# Patient Record
Sex: Male | Born: 1955 | Race: White | Hispanic: No | Marital: Single | Smoking: Current every day smoker
Health system: Southern US, Community
[De-identification: ages and names within clinical notes are randomized; demographics above are authoritative.]

## PROBLEM LIST (undated history)

## (undated) DIAGNOSIS — C801 Malignant (primary) neoplasm, unspecified: Secondary | ICD-10-CM

## (undated) DIAGNOSIS — M549 Dorsalgia, unspecified: Secondary | ICD-10-CM

## (undated) DIAGNOSIS — K859 Acute pancreatitis without necrosis or infection, unspecified: Secondary | ICD-10-CM

## (undated) HISTORY — PX: TONGUE SURGERY: SHX810

---

## 1999-02-17 ENCOUNTER — Emergency Department (HOSPITAL_COMMUNITY): Admission: EM | Admit: 1999-02-17 | Discharge: 1999-02-17 | Payer: Self-pay | Admitting: Emergency Medicine

## 2004-06-13 ENCOUNTER — Emergency Department: Payer: Self-pay | Admitting: Emergency Medicine

## 2004-06-13 ENCOUNTER — Other Ambulatory Visit: Payer: Self-pay

## 2005-10-01 ENCOUNTER — Emergency Department: Payer: Self-pay | Admitting: Emergency Medicine

## 2006-06-27 ENCOUNTER — Emergency Department: Payer: Self-pay | Admitting: General Practice

## 2006-10-22 ENCOUNTER — Emergency Department: Payer: Self-pay | Admitting: Emergency Medicine

## 2007-04-08 ENCOUNTER — Emergency Department: Payer: Self-pay | Admitting: Emergency Medicine

## 2007-06-18 ENCOUNTER — Emergency Department: Payer: Self-pay | Admitting: Internal Medicine

## 2008-03-08 ENCOUNTER — Emergency Department: Payer: Self-pay | Admitting: Internal Medicine

## 2008-04-16 ENCOUNTER — Emergency Department: Payer: Self-pay | Admitting: Emergency Medicine

## 2008-04-16 ENCOUNTER — Other Ambulatory Visit: Payer: Self-pay

## 2008-04-21 ENCOUNTER — Emergency Department: Payer: Self-pay | Admitting: Internal Medicine

## 2008-04-21 ENCOUNTER — Other Ambulatory Visit: Payer: Self-pay

## 2008-05-18 ENCOUNTER — Emergency Department: Payer: Self-pay | Admitting: Emergency Medicine

## 2008-12-07 ENCOUNTER — Emergency Department: Payer: Self-pay | Admitting: Emergency Medicine

## 2009-05-27 ENCOUNTER — Emergency Department: Payer: Self-pay | Admitting: Emergency Medicine

## 2009-08-18 ENCOUNTER — Emergency Department: Payer: Self-pay | Admitting: Emergency Medicine

## 2009-11-30 ENCOUNTER — Emergency Department: Payer: Self-pay | Admitting: Emergency Medicine

## 2010-04-17 ENCOUNTER — Emergency Department: Payer: Self-pay | Admitting: Emergency Medicine

## 2010-12-27 ENCOUNTER — Emergency Department (HOSPITAL_COMMUNITY)
Admission: EM | Admit: 2010-12-27 | Discharge: 2010-12-27 | Disposition: A | Discharge status: Home / Self Care | Payer: Self-pay | Attending: Emergency Medicine | Admitting: Emergency Medicine

## 2010-12-27 DIAGNOSIS — M545 Low back pain, unspecified: Secondary | ICD-10-CM | POA: Insufficient documentation

## 2010-12-27 DIAGNOSIS — Z85819 Personal history of malignant neoplasm of unspecified site of lip, oral cavity, and pharynx: Secondary | ICD-10-CM | POA: Insufficient documentation

## 2011-01-25 ENCOUNTER — Emergency Department: Payer: Self-pay | Admitting: Emergency Medicine

## 2011-03-04 ENCOUNTER — Encounter: Payer: Self-pay | Admitting: *Deleted

## 2011-03-04 ENCOUNTER — Emergency Department (HOSPITAL_COMMUNITY)
Admission: EM | Admit: 2011-03-04 | Discharge: 2011-03-04 | Disposition: A | Discharge status: Home / Self Care | Payer: Self-pay | Attending: Emergency Medicine | Admitting: Emergency Medicine

## 2011-03-04 DIAGNOSIS — Z859 Personal history of malignant neoplasm, unspecified: Secondary | ICD-10-CM | POA: Insufficient documentation

## 2011-03-04 DIAGNOSIS — M545 Low back pain, unspecified: Secondary | ICD-10-CM | POA: Insufficient documentation

## 2011-03-04 DIAGNOSIS — G8929 Other chronic pain: Secondary | ICD-10-CM | POA: Insufficient documentation

## 2011-03-04 DIAGNOSIS — F17200 Nicotine dependence, unspecified, uncomplicated: Secondary | ICD-10-CM | POA: Insufficient documentation

## 2011-03-04 DIAGNOSIS — M549 Dorsalgia, unspecified: Secondary | ICD-10-CM

## 2011-03-04 HISTORY — DX: Dorsalgia, unspecified: M54.9

## 2011-03-04 HISTORY — DX: Malignant (primary) neoplasm, unspecified: C80.1

## 2011-03-04 MED ORDER — TRAMADOL HCL 50 MG PO TABS: 50.0000 mg | ORAL_TABLET | Freq: Four times a day (QID) | ORAL | Status: AC | PRN

## 2011-03-04 MED ORDER — METHOCARBAMOL 500 MG PO TABS: ORAL_TABLET | ORAL | Status: DC

## 2011-03-04 NOTE — ED Provider Notes (Signed)
History     Chief Complaint  Patient presents with  . Back Pain   HPI Comments: Pt has hx "pinched nerve". He has been doing more walking and standing than usual and has some increase in pain. He states he can not afford a specialist or pain management doctor. He states the pain is getting worse and presents for assistance.  Patient is a 55 y.o. male presenting with back pain. The history is provided by the patient.  Back Pain  This is a chronic problem. The problem occurs daily. The problem has not changed since onset.The pain is present in the lumbar spine. The quality of the pain is described as shooting and aching. The pain radiates to the right thigh. The pain is moderate. Stiffness is present all day. Pertinent negatives include no chest pain, no abdominal pain, no perianal numbness, no bladder incontinence and no dysuria. He has tried bed rest for the symptoms. The treatment provided no relief.    Past Medical History  Diagnosis Date  . Back pain   . Cancer     mouth and throat cancer.     Past Surgical History  Procedure Date  . Tongue surgery     due to cancer    No family history on file.  History  Substance Use Topics  . Smoking status: Current Everyday Smoker -- 0.5 packs/day for 40 years    Types: Cigarettes  . Smokeless tobacco: Not on file  . Alcohol Use: Yes     occassionaly      Review of Systems  Constitutional: Negative for activity change.       All ROS Neg except as noted in HPI  HENT: Negative for nosebleeds and neck pain.        Tongue surg due to cancer  Eyes: Negative for photophobia and discharge.  Respiratory: Negative for cough, shortness of breath and wheezing.   Cardiovascular: Negative for chest pain and palpitations.  Gastrointestinal: Negative for abdominal pain and blood in stool.  Genitourinary: Negative for bladder incontinence, dysuria, frequency and hematuria.  Musculoskeletal: Positive for back pain.  Skin: Negative.     Neurological: Negative for dizziness, seizures and speech difficulty.  Psychiatric/Behavioral: Negative for hallucinations and confusion.    Physical Exam  BP 136/94  Pulse 83  Temp(Src) 98.3 F (36.8 C) (Oral)  Resp 16  Ht 5\' 10"  (1.778 m)  Wt 165 lb (74.844 kg)  BMI 23.68 kg/m2  SpO2 99%  Physical Exam  Nursing note and vitals reviewed. Constitutional: He is oriented to person, place, and time. He appears well-developed and well-nourished.  Non-toxic appearance.  HENT:  Head: Normocephalic.  Right Ear: Tympanic membrane and external ear normal.  Left Ear: Tympanic membrane and external ear normal.  Eyes: EOM and lids are normal. Pupils are equal, round, and reactive to light.  Neck: Normal range of motion. Neck supple. Carotid bruit is not present.  Cardiovascular: Normal rate, regular rhythm, normal heart sounds, intact distal pulses and normal pulses.   Pulmonary/Chest: Breath sounds normal. No respiratory distress.  Abdominal: Soft. Bowel sounds are normal. There is no tenderness. There is no guarding.  Musculoskeletal:       Slight decrease in ROM of the right lower ext. Pain with ROM of the lower back.  Lymphadenopathy:       Head (right side): No submandibular adenopathy present.       Head (left side): No submandibular adenopathy present.    He has no cervical adenopathy.  Neurological: He is alert and oriented to person, place, and time. He has normal strength. No cranial nerve deficit or sensory deficit.  Skin: Skin is warm and dry.  Psychiatric: He has a normal mood and affect. His speech is normal.    ED Course  Procedures  MDM I have reviewed nursing notes, vital signs, and all appropriate lab and imaging results for this patient.      Kathie Dike, [place] 03/04/11 1249

## 2011-03-04 NOTE — ED Notes (Signed)
Pt states lower back pain due to pinched nerve. Pt states chronic pain for years. Pain flared up again 2 weeks ago.

## 2011-08-12 ENCOUNTER — Emergency Department: Payer: Self-pay | Admitting: Emergency Medicine

## 2011-11-06 ENCOUNTER — Emergency Department (HOSPITAL_COMMUNITY)
Admission: EM | Admit: 2011-11-06 | Discharge: 2011-11-07 | Disposition: A | Discharge status: Home / Self Care | Payer: Self-pay | Attending: Emergency Medicine | Admitting: Emergency Medicine

## 2011-11-06 ENCOUNTER — Encounter (HOSPITAL_COMMUNITY): Payer: Self-pay | Admitting: *Deleted

## 2011-11-06 DIAGNOSIS — F3289 Other specified depressive episodes: Secondary | ICD-10-CM | POA: Insufficient documentation

## 2011-11-06 DIAGNOSIS — F17200 Nicotine dependence, unspecified, uncomplicated: Secondary | ICD-10-CM | POA: Insufficient documentation

## 2011-11-06 DIAGNOSIS — F1010 Alcohol abuse, uncomplicated: Secondary | ICD-10-CM | POA: Insufficient documentation

## 2011-11-06 DIAGNOSIS — R45851 Suicidal ideations: Secondary | ICD-10-CM | POA: Insufficient documentation

## 2011-11-06 DIAGNOSIS — F329 Major depressive disorder, single episode, unspecified: Secondary | ICD-10-CM | POA: Insufficient documentation

## 2011-11-06 DIAGNOSIS — Z8589 Personal history of malignant neoplasm of other organs and systems: Secondary | ICD-10-CM | POA: Insufficient documentation

## 2011-11-06 LAB — CBC
Hemoglobin: 15.5 g/dL (ref 13.0–17.0)
MCH: 35.3 pg — ABNORMAL HIGH (ref 26.0–34.0)
RBC: 4.39 MIL/uL (ref 4.22–5.81)
WBC: 7.3 10*3/uL (ref 4.0–10.5)
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
## 2011-11-07 LAB — ETHANOL: Alcohol, Ethyl (B): 335 mg/dL — ABNORMAL HIGH (ref 0–11)

## 2011-11-07 LAB — BASIC METABOLIC PANEL
BUN: 10 mg/dL (ref 6–23)
Calcium: 9.4 mg/dL (ref 8.4–10.5)
GFR calc non Af Amer: >90 mL/min (ref >90)
Glucose, Bld: 100 mg/dL — ABNORMAL HIGH (ref 70–99)

## 2011-11-07 LAB — RAPID URINE DRUG SCREEN, HOSP PERFORMED
Amphetamines: NOT DETECTED
Benzodiazepines: NOT DETECTED
Cocaine: NOT DETECTED
Opiates: NOT DETECTED

## 2011-11-07 MED ORDER — ZOLPIDEM TARTRATE 5 MG PO TABS: 5.0000 mg | ORAL_TABLET | Freq: Every evening | ORAL | Status: DC | PRN

## 2011-11-07 MED ORDER — LORAZEPAM 1 MG PO TABS
1.0000 mg | ORAL_TABLET | Freq: Three times a day (TID) | ORAL | Status: DC | PRN
  Administered 2011-11-07: 1 mg via ORAL
  Filled 2011-11-07: qty 1

## 2011-11-07 MED ORDER — NICOTINE 21 MG/24HR TD PT24: 21.0000 mg | MEDICATED_PATCH | Freq: Every day | TRANSDERMAL | Status: DC

## 2011-11-07 MED ORDER — IBUPROFEN 800 MG PO TABS
800.0000 mg | ORAL_TABLET | Freq: Three times a day (TID) | ORAL | Status: DC | PRN
  Administered 2011-11-07: 800 mg via ORAL
  Filled 2011-11-07: qty 1

## 2011-11-07 MED ORDER — IBUPROFEN 400 MG PO TABS
600.0000 mg | ORAL_TABLET | Freq: Three times a day (TID) | ORAL | Status: DC | PRN
  Administered 2011-11-07: 800 mg via ORAL
  Filled 2011-11-07: qty 2

## 2011-11-07 MED ORDER — NICOTINE 21 MG/24HR TD PT24
MEDICATED_PATCH | TRANSDERMAL | Status: AC
  Administered 2011-11-07: 01:00:00
  Filled 2011-11-07: qty 1

## 2011-11-07 MED ORDER — ONDANSETRON HCL 4 MG PO TABS: 4.0000 mg | ORAL_TABLET | Freq: Three times a day (TID) | ORAL | Status: DC | PRN

## 2011-11-07 NOTE — ED Provider Notes (Signed)
Pt is no longer suicidal.  He does not want inpatient help for alcohol.    Benny Lennert, MD 11/07/11 1013

## 2011-11-07 NOTE — Discharge Instructions (Signed)
Follow up with [facility] for alcohol abuse
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
## 2011-11-07 NOTE — ED Notes (Signed)
Pt c/o headache, meds given.
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
## 2011-11-07 NOTE — ED Notes (Signed)
Pt currently sleeping. Sitter remains at bedside.

## 2011-11-07 NOTE — ED Notes (Signed)
Pt presented to ED via [facility] pt reports he has no where to go, when further assessed, pt reports "I should just go out and shoot myself", pt also stated to security he shoud "hang himself", EDP notified and IVC papers initiated upon arrival, [employer] and security at bedside, sitter also at bedside, pt in paper scrubs and wanded by security, no contraband found

## 2012-08-07 ENCOUNTER — Emergency Department: Payer: Self-pay | Admitting: Emergency Medicine

## 2015-01-31 ENCOUNTER — Emergency Department
Admission: EM | Admit: 2015-01-31 | Discharge: 2015-01-31 | Disposition: A | Discharge status: Home / Self Care | Payer: Self-pay | Attending: Emergency Medicine | Admitting: Emergency Medicine

## 2015-01-31 ENCOUNTER — Emergency Department: Payer: Self-pay

## 2015-01-31 ENCOUNTER — Encounter: Payer: Self-pay | Admitting: Emergency Medicine

## 2015-01-31 DIAGNOSIS — Z720 Tobacco use: Secondary | ICD-10-CM | POA: Insufficient documentation

## 2015-01-31 DIAGNOSIS — K852 Alcohol induced acute pancreatitis without necrosis or infection: Secondary | ICD-10-CM

## 2015-01-31 DIAGNOSIS — O26899 Other specified pregnancy related conditions, unspecified trimester: Secondary | ICD-10-CM

## 2015-01-31 DIAGNOSIS — R079 Chest pain, unspecified: Secondary | ICD-10-CM | POA: Insufficient documentation

## 2015-01-31 DIAGNOSIS — R109 Unspecified abdominal pain: Secondary | ICD-10-CM

## 2015-01-31 DIAGNOSIS — F1010 Alcohol abuse, uncomplicated: Secondary | ICD-10-CM | POA: Insufficient documentation

## 2015-01-31 LAB — CBC WITH DIFFERENTIAL/PLATELET
Basophils Absolute: 0 10*3/uL (ref 0–0.1)
Basophils Relative: 1 %
EOS ABS: 0.1 10*3/uL (ref 0–0.7)
EOS PCT: 2 %
HCT: 40.2 % (ref 40.0–52.0)
HEMOGLOBIN: 13.4 g/dL (ref 13.0–18.0)
Lymphocytes Relative: 24 %
Lymphs Abs: 1.1 10*3/uL (ref 1.0–3.6)
MCH: 35.2 pg — ABNORMAL HIGH (ref 26.0–34.0)
MCHC: 33.4 g/dL (ref 32.0–36.0)
MCV: 105.4 fL — ABNORMAL HIGH (ref 80.0–100.0)
MONO ABS: 0.4 10*3/uL (ref 0.2–1.0)
Monocytes Relative: 9 %
NEUTROS ABS: 3 10*3/uL (ref 1.4–6.5)
NEUTROS PCT: 64 %
PLATELETS: 132 10*3/uL — AB (ref 150–440)
RBC: 3.82 MIL/uL — AB (ref 4.40–5.90)
RDW: 14.8 % — ABNORMAL HIGH (ref 11.5–14.5)
WBC: 4.7 10*3/uL (ref 3.8–10.6)

## 2015-01-31 LAB — COMPREHENSIVE METABOLIC PANEL
ALT: 15 U/L — ABNORMAL LOW (ref 17–63)
ANION GAP: 9 (ref 5–15)
AST: 23 U/L (ref 15–41)
Albumin: 4.1 g/dL (ref 3.5–5.0)
Alkaline Phosphatase: 76 U/L (ref 38–126)
BUN: 8 mg/dL (ref 6–20)
CO2: 26 mmol/L (ref 22–32)
CREATININE: 0.66 mg/dL (ref 0.61–1.24)
Calcium: 8.4 mg/dL — ABNORMAL LOW (ref 8.9–10.3)
Chloride: 102 mmol/L (ref 101–111)
GLUCOSE: 86 mg/dL (ref 65–99)
POTASSIUM: 3.9 mmol/L (ref 3.5–5.1)
Sodium: 137 mmol/L (ref 135–145)
TOTAL PROTEIN: 7.3 g/dL (ref 6.5–8.1)
Total Bilirubin: 0.4 mg/dL (ref 0.3–1.2)

## 2015-01-31 LAB — TROPONIN I

## 2015-01-31 LAB — LIPASE, BLOOD: Lipase: 76 U/L — ABNORMAL HIGH (ref 22–51)

## 2015-01-31 MED ORDER — MORPHINE SULFATE 4 MG/ML IJ SOLN
INTRAMUSCULAR | Status: AC
  Administered 2015-01-31: 4 mg via INTRAVENOUS
  Filled 2015-01-31: qty 1

## 2015-01-31 MED ORDER — MORPHINE SULFATE 4 MG/ML IJ SOLN
4.0000 mg | Freq: Once | INTRAMUSCULAR | Status: AC
  Administered 2015-01-31: 4 mg via INTRAVENOUS

## 2015-01-31 MED ORDER — SODIUM CHLORIDE 0.9 % IV SOLN
1000.0000 mL | Freq: Once | INTRAVENOUS | Status: AC
  Administered 2015-01-31: 1000 mL via INTRAVENOUS

## 2015-01-31 MED ORDER — ONDANSETRON HCL 4 MG PO TABS: 4.0000 mg | ORAL_TABLET | Freq: Every day | ORAL | Status: AC | PRN

## 2015-01-31 MED ORDER — OXYCODONE-ACETAMINOPHEN 5-325 MG PO TABS: 1.0000 | ORAL_TABLET | Freq: Four times a day (QID) | ORAL | Status: AC | PRN

## 2015-01-31 NOTE — [facility] Counselor (Signed)
Per request of ER MD (Dr. Jimmye Norman), writer provided the pt. with information and instructions on how to access [facility] ([facility]).

## 2015-01-31 NOTE — Discharge Instructions (Signed)

## 2015-01-31 NOTE — ED Provider Notes (Signed)
[facility] Emergency Department Provider Note     Time seen: ----------------------------------------- 12:49 PM on 01/31/2015 -----------------------------------------    I have reviewed the triage vital signs and the nursing notes.   HISTORY  Chief Complaint Chest Pain    HPI Carlos Wagner is a 59 y.o. male present ER with several days of abdominal pain that radiates in his chest. Patient complains of pain across his upper abdomen, dull, nothing makes it better or worse. Patient states he drinks alcohol heavily every day, not always working however. Complains of nausea no vomiting, denies fevers chills or shortness of breath. Pain is currently moderate.   Past Medical History  Diagnosis Date  . Back pain   . Cancer     mouth and throat cancer.     There are no active problems to display for this patient.   Past Surgical History  Procedure Laterality Date  . Tongue surgery      due to cancer    Allergies Review of patient's allergies indicates no known allergies.  Social History History  Substance Use Topics  . Smoking status: Current Every Day Smoker -- 0.50 packs/day for 40 years    Types: Cigarettes  . Smokeless tobacco: Not on file  . Alcohol Use: Yes     Comment: occassionaly    Review of Systems Constitutional: Negative for fever. Eyes: Negative for visual changes. ENT: Negative for sore throat. Cardiovascular: Positive for chest pain Respiratory: Negative for shortness of breath. Gastrointestinal: Positive abdominal pain Genitourinary: Negative for dysuria. Musculoskeletal: Negative for back pain. Skin: Negative for rash. Neurological: Negative for headaches, focal weakness or numbness.  10-point ROS otherwise negative.  ____________________________________________   PHYSICAL EXAM:  VITAL SIGNS: ED Triage Vitals  Enc Vitals Group     BP 01/31/15 1235 133/82 mmHg     Pulse Rate 01/31/15 1235 76     Resp  01/31/15 1235 20     Temp 01/31/15 1235 97 F (36.1 C)     Temp Source 01/31/15 1235 Oral     SpO2 01/31/15 1235 98 %     Weight 01/31/15 1235 155 lb (70.308 kg)     Height 01/31/15 1235 5\' 10"  (1.778 m)     Head Cir --      Peak Flow --      Pain Score 01/31/15 1235 9     Pain Loc --      Pain Edu? --      Excl. in [place]? --     Constitutional: Alert and oriented. Well appearing and in no distress. Eyes: Conjunctivae are normal. PERRL. Normal extraocular movements. ENT   Head: Normocephalic and atraumatic.   Nose: No congestion/rhinnorhea.   Mouth/Throat: Mucous membranes are moist.   Neck: No stridor. Hematological/Lymphatic/Immunilogical: No cervical lymphadenopathy. Cardiovascular: Normal rate, regular rhythm. Normal and symmetric distal pulses are present in all extremities. No murmurs, rubs, or gallops. Respiratory: Normal respiratory effort without tachypnea nor retractions. Breath sounds are clear and equal bilaterally. No wheezes/rales/rhonchi. Gastrointestinal: Tenderness across the upper abdomen, nonfocal. No rebound or guarding. Musculoskeletal: Nontender with normal range of motion in all extremities. No joint effusions.  No lower extremity tenderness nor edema. Neurologic:  Normal speech and language. No gross focal neurologic deficits are appreciated. Speech is normal. No gait instability. Skin:  Skin is warm, dry and intact. No rash noted. Psychiatric: Mood and affect are normal. Speech and behavior are normal. Patient exhibits appropriate insight and judgment. ____________________________________________  EKG: Interpreted by  me. Normal sinus rhythm, normal axis normal intervals, no evidence of acute infarction. Rate is 76  ____________________________________________  ED COURSE:  Pertinent labs & imaging results that were available during my care of the patient were reviewed by me and considered in my medical decision making (see chart for  details).  ____________________________________________    LABS (pertinent positives/negatives)  Labs Reviewed  COMPREHENSIVE METABOLIC PANEL - Abnormal; Notable for the following:    Calcium 8.4 (*)    ALT 15 (*)    All other components within normal limits  LIPASE, BLOOD - Abnormal; Notable for the following:    Lipase 76 (*)    All other components within normal limits  CBC WITH DIFFERENTIAL/PLATELET - Abnormal; Notable for the following:    RBC 3.82 (*)    MCV 105.4 (*)    MCH 35.2 (*)    RDW 14.8 (*)    Platelets 132 (*)    All other components within normal limits  TROPONIN I    RADIOLOGY  Acute abdominal series  ____________________________________________  FINAL ASSESSMENT AND PLAN  Abdominal pain, pancreatitis  Plan: Patient is in no acute distress, he was given referral information to alcohol detox centers. He is given pain medicine and antiemetics to get him through this acute pancreatitis episode. His vital signs are stable, he is agreeable to plan. Stable for outpatient follow-up.   Earleen Newport, MD   Earleen Newport, MD 02/01/15 [phone]

## 2015-01-31 NOTE — ED Notes (Signed)
Presents via ems from home with mid chest pain   States pain is sharp and radiates thur entire chest ..pin has been intermittent

## 2015-01-31 NOTE — ED Notes (Signed)
MD at bedside. 

## 2015-03-06 ENCOUNTER — Encounter: Payer: Self-pay | Admitting: Emergency Medicine

## 2015-03-06 ENCOUNTER — Emergency Department: Payer: Self-pay

## 2015-03-06 ENCOUNTER — Emergency Department
Admission: EM | Admit: 2015-03-06 | Discharge: 2015-03-06 | Disposition: A | Discharge status: Home / Self Care | Payer: Self-pay | Attending: Emergency Medicine | Admitting: Emergency Medicine

## 2015-03-06 DIAGNOSIS — Y9289 Other specified places as the place of occurrence of the external cause: Secondary | ICD-10-CM | POA: Insufficient documentation

## 2015-03-06 DIAGNOSIS — Y280XXA Contact with sharp glass, undetermined intent, initial encounter: Secondary | ICD-10-CM | POA: Insufficient documentation

## 2015-03-06 DIAGNOSIS — IMO0002 Reserved for concepts with insufficient information to code with codable children: Secondary | ICD-10-CM

## 2015-03-06 DIAGNOSIS — Z720 Tobacco use: Secondary | ICD-10-CM | POA: Insufficient documentation

## 2015-03-06 DIAGNOSIS — Y9389 Activity, other specified: Secondary | ICD-10-CM | POA: Insufficient documentation

## 2015-03-06 DIAGNOSIS — Y998 Other external cause status: Secondary | ICD-10-CM | POA: Insufficient documentation

## 2015-03-06 DIAGNOSIS — S61411A Laceration without foreign body of right hand, initial encounter: Secondary | ICD-10-CM | POA: Insufficient documentation

## 2015-03-06 MED ORDER — HYDROCODONE-ACETAMINOPHEN 5-325 MG PO TABS: 1.0000 | ORAL_TABLET | ORAL | Status: AC | PRN

## 2015-03-06 MED ORDER — CEPHALEXIN 500 MG PO CAPS: 500.0000 mg | ORAL_CAPSULE | Freq: Three times a day (TID) | ORAL | Status: AC

## 2015-03-06 MED ORDER — LIDOCAINE HCL (PF) 1 % IJ SOLN
5.0000 mL | Freq: Once | INTRAMUSCULAR | Status: DC
  Filled 2015-03-06: qty 5

## 2015-03-06 MED ORDER — IBUPROFEN 800 MG PO TABS: 800.0000 mg | ORAL_TABLET | Freq: Three times a day (TID) | ORAL | Status: AC | PRN

## 2015-03-06 MED ORDER — TETANUS-DIPHTH-ACELL PERTUSSIS 5-2.5-18.5 LF-MCG/0.5 IM SUSP
0.5000 mL | Freq: Once | INTRAMUSCULAR | Status: DC
  Filled 2015-03-06: qty 0.5
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
## 2015-03-06 NOTE — ED Provider Notes (Signed)
[facility] Emergency Department Provider Note  ____________________________________________  Time seen: Approximately 4:48 PM  I have reviewed the triage vital signs and the nursing notes.   HISTORY  Chief Complaint Extremity Laceration    HPI Carlos Wagner is a 59 y.o. male who injured his right hand earlier today. He was pushing up a window when it broke and cut his right palm.He is unsure if any glass is remaining in his hand. His last tetanus was over 10 years ago.   Past Medical History  Diagnosis Date  . Back pain   . Cancer     mouth and throat cancer.     There are no active problems to display for this patient.   Past Surgical History  Procedure Laterality Date  . Tongue surgery      due to cancer    Current Outpatient Rx  Name  Route  Sig  Dispense  Refill  . cephALEXin (KEFLEX) 500 MG capsule   Oral   Take 1 capsule (500 mg total) by mouth 3 (three) times daily.   9 capsule   0   . HYDROcodone-acetaminophen (NORCO) 5-325 MG per tablet   Oral   Take 1 tablet by mouth every 4 (four) hours as needed for moderate pain.   6 tablet   0   . ibuprofen (ADVIL,MOTRIN) 800 MG tablet   Oral   Take 1 tablet (800 mg total) by mouth every 8 (eight) hours as needed.   9 tablet   0   . ondansetron (ZOFRAN) 4 MG tablet   Oral   Take 1 tablet (4 mg total) by mouth daily as needed for nausea or vomiting.   20 tablet   1   . oxyCODONE-acetaminophen (ROXICET) 5-325 MG per tablet   Oral   Take 1 tablet by mouth every 6 (six) hours as needed.   12 tablet   0     Allergies Review of patient's allergies indicates no known allergies.  History reviewed. No pertinent family history.  Social History History  Substance Use Topics  . Smoking status: Current Every Day Smoker -- 0.50 packs/day for 40 years    Types: Cigarettes  . Smokeless tobacco: Not on file  . Alcohol Use: Yes     Comment: occassionaly    Review of  SystemsConstitutional: No fever/chills Eyes: No visual changes. ENT: No sore throat. Cardiovascular: Denies chest pain. Respiratory: Denies shortness of breath. Gastrointestinal: No abdominal pain.  No nausea, no vomiting.  No diarrhea.  No constipation. Genitourinary: Negative for dysuria. Musculoskeletal: Negative for back pain. Skin: Negative for rash. Neurological: Negative for headaches, focal weakness or numbness.  10-point ROS otherwise negative.  ____________________________________________   PHYSICAL EXAM:  VITAL SIGNS: ED Triage Vitals  Enc Vitals Group     BP 03/06/15 1628 136/74 mmHg     Pulse Rate 03/06/15 1628 93     Resp 03/06/15 1628 16     Temp 03/06/15 1628 98.6 F (37 C)     Temp src --      SpO2 03/06/15 1628 100 %     Weight 03/06/15 1628 148 lb (67.132 kg)     Height 03/06/15 1628 5\' 8"  (1.727 m)     Head Cir --      Peak Flow --      Pain Score 03/06/15 1630 6     Pain Loc --      Pain Edu? --      Excl. in  [place]? --     Constitutional: Alert and oriented. Well appearing and in no acute distress.\ Cardiovascular: Normal rate, regular rhythm. Grossly normal heart sounds.  Good peripheral circulation. Respiratory: Normal respiratory effort.  No retractions. Lungs CTAB. Gastrointestinal: Soft and nontender. No distention. No abdominal bruits. No CVA tenderness. Musculoskeletal: No lower extremity tenderness nor edema.  No joint effusions. Neurologic:  Normal speech and language. No gross focal neurologic deficits are appreciated. No gait instability. Skin:  Skin is warm, dry and intact. No rash noted. See note below. Psychiatric: Mood and affect are normal. Speech and behavior are normal.  ____________________________________________   LABS (all labs ordered are listed, but only abnormal results are displayed)  Labs Reviewed - No data to  display ____________________________________________  EKG   ____________________________________________  RADIOLOGY  EXAM: RIGHT HAND - COMPLETE 3+ VIEW  COMPARISON: None.  FINDINGS: There is no evidence of fracture or dislocation. There is no evidence of arthropathy or other focal bone abnormality. Soft tissues are unremarkable. No radiopaque foreign body is evident. The palmar surface the hand is somewhat obscured by positioning.  IMPRESSION: Negative right hand radiographs.   Electronically Signed  By: San Morelle M.D.  On: 03/06/2015 17:06 ____________________________________________   PROCEDURES  Procedure(s) performed: LACERATION REPAIR Performed by: Mortimer Fries Authorized by: Mortimer Fries Consent: Verbal consent obtained. Risks and benefits: risks, benefits and alternatives were discussed Consent given by: patient Patient identity confirmed: provided demographic data Prepped and Draped in normal sterile fashion Wound explored  Laceration Location: Right palm  Laceration Length: 1.5 cm  No Foreign Bodies seen or palpated. Wound irrigated with saline  Anesthesia: local infiltration  Local anesthetic: lidocaine 1 %    Anesthetic total: 2 ml  Irrigation method: syringe Amount of cleaning: standard  Skin closure: yes   Number of sutures: #2     5-0 nylon  Technique: simple interrupted  Patient tolerance: Patient tolerated the procedure well with no immediate complications.   Critical Care performed: No  ____________________________________________   INITIAL IMPRESSION / ASSESSMENT AND PLAN / ED COURSE  Pertinent labs & imaging results that were available during my care of the patient were reviewed by me and considered in my medical decision making (see chart for details).  59 year old male with laceration to the right hand. Negative x-rays. No foreign body noted. Suture repair performed. She tolerated well. Given 3 days  of Keflex. Also Norco No. 6. Return in 7-10 days for suture removal. ____________________________________________   FINAL CLINICAL IMPRESSION(S) / ED DIAGNOSES  Final diagnoses:  Laceration      Wisam Siefring, PA-C 03/06/15 1730  Orbie Pyo, MD 03/06/15 [phone]

## 2015-03-06 NOTE — Discharge Instructions (Signed)
Laceration Care, Adult °A laceration is a cut or lesion that goes through all layers of the skin and into the tissue just beneath the skin. °TREATMENT  °Some lacerations may not require closure. Some lacerations may not be able to be closed due to an increased risk of infection. It is important to see your caregiver as soon as possible after an injury to minimize the risk of infection and maximize the opportunity for successful closure. °If closure is appropriate, pain medicines may be given, if needed. The wound will be cleaned to help prevent infection. Your caregiver will use stitches (sutures), staples, wound glue (adhesive), or skin adhesive strips to repair the laceration. These tools bring the skin edges together to allow for faster healing and a better cosmetic outcome. However, all wounds will heal with a scar. Once the wound has healed, scarring can be minimized by covering the wound with sunscreen during the day for 1 full year. °HOME CARE INSTRUCTIONS  °For sutures or staples: °· Keep the wound clean and dry. °· If you were given a bandage (dressing), you should change it at least once a day. Also, change the dressing if it becomes wet or dirty, or as directed by your caregiver. °· Wash the wound with soap and water 2 times a day. Rinse the wound off with water to remove all soap. Pat the wound dry with a clean towel. °· After cleaning, apply a thin layer of the antibiotic ointment as recommended by your caregiver. This will help prevent infection and keep the dressing from sticking. °· You may shower as usual after the first 24 hours. Do not soak the wound in water until the sutures are removed. °· Only take over-the-counter or prescription medicines for pain, discomfort, or fever as directed by your caregiver. °· Get your sutures or staples removed as directed by your caregiver. °For skin adhesive strips: °· Keep the wound clean and dry. °· Do not get the skin adhesive strips wet. You may bathe  carefully, using caution to keep the wound dry. °· If the wound gets wet, pat it dry with a clean towel. °· Skin adhesive strips will fall off on their own. You may trim the strips as the wound heals. Do not remove skin adhesive strips that are still stuck to the wound. They will fall off in time. °For wound adhesive: °· You may briefly wet your wound in the shower or bath. Do not soak or scrub the wound. Do not swim. Avoid periods of heavy perspiration until the skin adhesive has fallen off on its own. After showering or bathing, gently pat the wound dry with a clean towel. °· Do not apply liquid medicine, cream medicine, or ointment medicine to your wound while the skin adhesive is in place. This may loosen the film before your wound is healed. °· If a dressing is placed over the wound, be careful not to apply tape directly over the skin adhesive. This may cause the adhesive to be pulled off before the wound is healed. °· Avoid prolonged exposure to sunlight or tanning lamps while the skin adhesive is in place. Exposure to ultraviolet light in the first year will darken the scar. °· The skin adhesive will usually remain in place for 5 to 10 days, then naturally fall off the skin. Do not pick at the adhesive film. °You may need a tetanus shot if: °· You cannot remember when you had your last tetanus shot. °· You have never had a tetanus   shot. If you get a tetanus shot, your arm may swell, get red, and feel warm to the touch. This is common and not a problem. If you need a tetanus shot and you choose not to have one, there is a rare chance of getting tetanus. Sickness from tetanus can be serious. SEEK MEDICAL CARE IF:   You have redness, swelling, or increasing pain in the wound.  You see a red line that goes away from the wound.  You have yellowish-white fluid (pus) coming from the wound.  You have a fever.  You notice a bad smell coming from the wound or dressing.  Your wound breaks open before or  after sutures have been removed.  You notice something coming out of the wound such as wood or glass.  Your wound is on your hand or foot and you cannot move a finger or toe. SEEK IMMEDIATE MEDICAL CARE IF:   Your pain is not controlled with prescribed medicine.  You have severe swelling around the wound causing pain and numbness or a change in color in your arm, hand, leg, or foot.  Your wound splits open and starts bleeding.  You have worsening numbness, weakness, or loss of function of any joint around or beyond the wound.  You develop painful lumps near the wound or on the skin anywhere on your body. MAKE SURE YOU:   Understand these instructions.  Will watch your condition.  Will get help right away if you are not doing well or get worse. Document Released: 08/09/2005 Document Revised: 11/01/2011 Document Reviewed: 02/02/2011 [facility] Patient Information 2015 Home, [place]. This information is not intended to replace advice given to you by your health care provider. Make sure you discuss any questions you have with your health care provider.   Don't get area wet for 2 days.  Change bandage daily and watch for signs of infection.  Return in 7-10 days for suture removal.  Return sooner for any concerns.

## 2015-09-17 ENCOUNTER — Emergency Department
Admission: EM | Admit: 2015-09-17 | Discharge: 2015-09-17 | Disposition: A | Discharge status: Home / Self Care | Payer: Self-pay | Attending: Emergency Medicine | Admitting: Emergency Medicine

## 2015-09-17 ENCOUNTER — Encounter: Payer: Self-pay | Admitting: Emergency Medicine

## 2015-09-17 ENCOUNTER — Emergency Department: Payer: Self-pay

## 2015-09-17 DIAGNOSIS — R112 Nausea with vomiting, unspecified: Secondary | ICD-10-CM | POA: Insufficient documentation

## 2015-09-17 DIAGNOSIS — R1013 Epigastric pain: Secondary | ICD-10-CM | POA: Insufficient documentation

## 2015-09-17 DIAGNOSIS — F17210 Nicotine dependence, cigarettes, uncomplicated: Secondary | ICD-10-CM | POA: Insufficient documentation

## 2015-09-17 DIAGNOSIS — Z79899 Other long term (current) drug therapy: Secondary | ICD-10-CM | POA: Insufficient documentation

## 2015-09-17 DIAGNOSIS — R1010 Upper abdominal pain, unspecified: Secondary | ICD-10-CM | POA: Insufficient documentation

## 2015-09-17 HISTORY — DX: Acute pancreatitis without necrosis or infection, unspecified: K85.90

## 2015-09-17 LAB — URINALYSIS COMPLETE WITH MICROSCOPIC (ARMC ONLY)
Bacteria, UA: NONE SEEN
Bilirubin Urine: NEGATIVE
GLUCOSE, UA: NEGATIVE mg/dL
Hgb urine dipstick: NEGATIVE
Leukocytes, UA: NEGATIVE
NITRITE: NEGATIVE
Protein, ur: NEGATIVE mg/dL
SPECIFIC GRAVITY, URINE: 1.046 — AB (ref 1.005–1.030)
Squamous Epithelial / LPF: NONE SEEN
pH: 5 (ref 5.0–8.0)

## 2015-09-17 LAB — COMPREHENSIVE METABOLIC PANEL
ALBUMIN: 4 g/dL (ref 3.5–5.0)
ALT: 25 U/L (ref 17–63)
ANION GAP: 14 (ref 5–15)
AST: 26 U/L (ref 15–41)
Alkaline Phosphatase: 71 U/L (ref 38–126)
BILIRUBIN TOTAL: 0.8 mg/dL (ref 0.3–1.2)
BUN: 32 mg/dL — AB (ref 6–20)
CO2: 20 mmol/L — AB (ref 22–32)
CREATININE: 0.92 mg/dL (ref 0.61–1.24)
Calcium: 9 mg/dL (ref 8.9–10.3)
Chloride: 101 mmol/L (ref 101–111)
GFR calc Af Amer: >60 mL/min (ref >60)
GFR calc non Af Amer: >60 mL/min (ref >60)
GLUCOSE: 103 mg/dL — AB (ref 65–99)
Potassium: 4.2 mmol/L (ref 3.5–5.1)
SODIUM: 135 mmol/L (ref 135–145)
TOTAL PROTEIN: 7.6 g/dL (ref 6.5–8.1)

## 2015-09-17 LAB — CBC
HCT: 42 % (ref 40.0–52.0)
HEMOGLOBIN: 13.8 g/dL (ref 13.0–18.0)
MCH: 32.3 pg (ref 26.0–34.0)
MCHC: 32.8 g/dL (ref 32.0–36.0)
MCV: 98.5 fL (ref 80.0–100.0)
PLATELETS: 229 10*3/uL (ref 150–440)
RBC: 4.27 MIL/uL — ABNORMAL LOW (ref 4.40–5.90)
RDW: 15.9 % — AB (ref 11.5–14.5)
WBC: 6 10*3/uL (ref 3.8–10.6)

## 2015-09-17 LAB — LIPASE, BLOOD: Lipase: 34 U/L (ref 11–51)

## 2015-09-17 MED ORDER — GI COCKTAIL ~~LOC~~
30.0000 mL | ORAL | Status: AC
  Administered 2015-09-17: 30 mL via ORAL

## 2015-09-17 MED ORDER — RANITIDINE HCL 150 MG PO CAPS: 150.0000 mg | ORAL_CAPSULE | Freq: Two times a day (BID) | ORAL | Status: AC

## 2015-09-17 MED ORDER — KETOROLAC TROMETHAMINE 30 MG/ML IJ SOLN
INTRAMUSCULAR | Status: AC
  Filled 2015-09-17: qty 1

## 2015-09-17 MED ORDER — GI COCKTAIL ~~LOC~~
ORAL | Status: AC
  Filled 2015-09-17: qty 30

## 2015-09-17 MED ORDER — KETOROLAC TROMETHAMINE 30 MG/ML IJ SOLN
30.0000 mg | Freq: Once | INTRAMUSCULAR | Status: AC
  Administered 2015-09-17: 30 mg via INTRAMUSCULAR

## 2015-09-17 MED ORDER — PROMETHAZINE HCL 25 MG PO TABS
ORAL_TABLET | ORAL | Status: AC
  Filled 2015-09-17: qty 1

## 2015-09-17 MED ORDER — FAMOTIDINE 20 MG PO TABS
ORAL_TABLET | ORAL | Status: AC
  Filled 2015-09-17: qty 2

## 2015-09-17 MED ORDER — PROMETHAZINE HCL 25 MG PO TABS
25.0000 mg | ORAL_TABLET | Freq: Once | ORAL | Status: AC
  Administered 2015-09-17: 25 mg via ORAL

## 2015-09-17 MED ORDER — SUCRALFATE 1 G PO TABS: 1.0000 g | ORAL_TABLET | Freq: Four times a day (QID) | ORAL | Status: AC

## 2015-09-17 MED ORDER — ONDANSETRON 4 MG PO TBDP
4.0000 mg | ORAL_TABLET | Freq: Once | ORAL | Status: AC | PRN
  Administered 2015-09-17: 4 mg via ORAL

## 2015-09-17 MED ORDER — PROMETHAZINE HCL 25 MG PO TABS: 25.0000 mg | ORAL_TABLET | Freq: Four times a day (QID) | ORAL | Status: AC | PRN

## 2015-09-17 MED ORDER — ONDANSETRON 4 MG PO TBDP
ORAL_TABLET | ORAL | Status: AC
  Filled 2015-09-17: qty 1

## 2015-09-17 MED ORDER — FAMOTIDINE 20 MG PO TABS
40.0000 mg | ORAL_TABLET | Freq: Once | ORAL | Status: AC
  Administered 2015-09-17: 40 mg via ORAL

## 2015-09-17 NOTE — ED Notes (Signed)
Pt states upper epagstric abd pain for about 1 month, states nausea in the past week, states nausea and pain increased with food

## 2015-09-17 NOTE — ED Notes (Signed)
Patient presents to the ED with upper, central abdominal pain x 2 months.  Patient states pain has been worse for the past 1.5 weeks.  Patient reports history of pancreatitis and states this pain is worse.  Patient is sitting in a relaxed position in triage with no obvious distress.  Patient reports vomiting after he eats.  Patient reports vomiting x 2 today.  Patient reports constipation, last bm was 2 days ago.  Patient states bm used to be regular but since pain began the constipation also started.  Respirations even and not labored.

## 2015-09-17 NOTE — Discharge Instructions (Signed)

## 2015-09-17 NOTE — ED Provider Notes (Signed)
[facility] Emergency Department Provider Note  ____________________________________________  Time seen: 6:00 PM  I have reviewed the triage vital signs and the nursing notes.   HISTORY  Chief Complaint Abdominal Pain    HPI Carlos Wagner is a 60 y.o. male who complains of upper abdominal pain for the past month with nausea this week. Seems to be worse after he eats has a history of pancreatitis but this feels somewhat different. Today he's been having some vomiting as well and having difficulty tolerating food. Normal bowel movements, no fever chest pain shortness of breath dizziness or syncope.     Past Medical History  Diagnosis Date  . Back pain   . Cancer (HCC)     mouth and throat cancer.   . Pancreatitis      There are no active problems to display for this patient.    Past Surgical History  Procedure Laterality Date  . Tongue surgery      due to cancer     Current Outpatient Rx  Name  Route  Sig  Dispense  Refill  . ibuprofen (ADVIL,MOTRIN) 800 MG tablet   Oral   Take 1 tablet (800 mg total) by mouth every 8 (eight) hours as needed.   9 tablet   0   . HYDROcodone-acetaminophen (NORCO) 5-325 MG per tablet   Oral   Take 1 tablet by mouth every 4 (four) hours as needed for moderate pain.   6 tablet   0   . ondansetron (ZOFRAN) 4 MG tablet   Oral   Take 1 tablet (4 mg total) by mouth daily as needed for nausea or vomiting.   20 tablet   1   . oxyCODONE-acetaminophen (ROXICET) 5-325 MG per tablet   Oral   Take 1 tablet by mouth every 6 (six) hours as needed.   12 tablet   0   . promethazine (PHENERGAN) 25 MG tablet   Oral   Take 1 tablet (25 mg total) by mouth every 6 (six) hours as needed for nausea or vomiting.   15 tablet   0   . ranitidine (ZANTAC) 150 MG capsule   Oral   Take 1 capsule (150 mg total) by mouth 2 (two) times daily.   28 capsule   0   . sucralfate (CARAFATE) 1 g tablet   Oral   Take 1  tablet (1 g total) by mouth 4 (four) times daily.   120 tablet   1      Allergies Review of patient's allergies indicates no known allergies.   No family history on file.  Social History Social History  Substance Use Topics  . Smoking status: Current Every Day Smoker -- 0.50 packs/day for 40 years    Types: Cigarettes  . Smokeless tobacco: None  . Alcohol Use: No     Comment: stopped drinking three months ago (09/17/15) and states he used to drink every day    Review of Systems  Constitutional:   No fever or chills. No weight changes Eyes:   No blurry vision or double vision.  ENT:   No sore throat. Cardiovascular:   No chest pain. Respiratory:   No dyspnea or cough. Gastrointestinal:   Positive upper abdominal pain with vomiting and no diarrhea.  No BRBPR or melena. Genitourinary:   Negative for dysuria, urinary retention, bloody urine, or difficulty urinating. Musculoskeletal:   Negative for back pain. No joint swelling or pain. Skin:   Negative for rash. Neurological:  Negative for headaches, focal weakness or numbness. Psychiatric:  No anxiety or depression.   Endocrine:  No hot/cold intolerance, changes in energy, or sleep difficulty.  10-point ROS otherwise negative.  ____________________________________________   PHYSICAL EXAM:  VITAL SIGNS: ED Triage Vitals  Enc Vitals Group     BP 09/17/15 1705 94/58 mmHg     Pulse Rate 09/17/15 1705 96     Resp 09/17/15 1705 18     Temp 09/17/15 1705 97.9 F (36.6 C)     Temp Source 09/17/15 1705 Oral     SpO2 09/17/15 1705 96 %     Weight 09/17/15 1705 160 lb (72.576 kg)     Height 09/17/15 1705 5\' 10"  (1.778 m)     Head Cir --      Peak Flow --      Pain Score 09/17/15 1705 10     Pain Loc --      Pain Edu? --      Excl. in [place]? --     Vital signs reviewed, nursing assessments reviewed.   Constitutional:   Alert and oriented. Well appearing and in no distress. Eyes:   No scleral icterus. No  conjunctival pallor. PERRL. EOMI ENT   Head:   Normocephalic and atraumatic.   Nose:   No congestion/rhinnorhea. No septal hematoma   Mouth/Throat:   MMM, no pharyngeal erythema. No peritonsillar mass. No uvula shift.   Neck:   No stridor. No SubQ emphysema. No meningismus. Hematological/Lymphatic/Immunilogical:   No cervical lymphadenopathy. Cardiovascular:   RRR. Normal and symmetric distal pulses are present in all extremities. No murmurs, rubs, or gallops. Respiratory:   Normal respiratory effort without tachypnea nor retractions. Breath sounds are clear and equal bilaterally. No wheezes/rales/rhonchi. Gastrointestinal:   Soft with epigastric and right upper quadrant tenderness. Negative Murphy sign.. No distention. There is no CVA tenderness.  No rebound, rigidity, or guarding. Genitourinary:   deferred Musculoskeletal:   Nontender with normal range of motion in all extremities. No joint effusions.  No lower extremity tenderness.  No edema. Neurologic:   Normal speech and language.  CN 2-10 normal. Motor grossly intact. No pronator drift.  Normal gait. No gross focal neurologic deficits are appreciated.  Skin:    Skin is warm, dry and intact. No rash noted.  No petechiae, purpura, or bullae. Psychiatric:   Mood and affect are normal. Speech and behavior are normal. Patient exhibits appropriate insight and judgment.  ____________________________________________    LABS (pertinent positives/negatives) (all labs ordered are listed, but only abnormal results are displayed) Labs Reviewed  COMPREHENSIVE METABOLIC PANEL - Abnormal; Notable for the following:    CO2 20 (*)    Glucose, Bld 103 (*)    BUN 32 (*)    All other components within normal limits  CBC - Abnormal; Notable for the following:    RBC 4.27 (*)    RDW 15.9 (*)    All other components within normal limits  URINALYSIS COMPLETEWITH MICROSCOPIC ([facility] ONLY) - Abnormal; Notable for the following:    Color,  Urine YELLOW (*)    APPearance CLEAR (*)    Ketones, ur 1+ (*)    Specific Gravity, Urine 1.046 (*)    All other components within normal limits  LIPASE, BLOOD   ____________________________________________   EKG    ____________________________________________    RADIOLOGY  Ultrasound right upper quadrant unremarkable  ____________________________________________   PROCEDURES   ____________________________________________   INITIAL IMPRESSION / ASSESSMENT AND PLAN / ED COURSE  Pertinent labs & imaging results that were available during my care of the patient were reviewed by me and considered in my medical decision making (see chart for details).  Patient presents with nausea vomiting or upper abdominal pain. It exam concerning for biliary pathology versus pancreatitis versus GERD and gastritis. Labs are all unremarkable. Ultrasound unremarkable at this point is low suspicion for a serious GI pathology. Low suspicion for perforation or obstruction or GI bleed. We'll discharge home with a trial of antacids and nausea medicine and have him follow up with primary care.     ____________________________________________   FINAL CLINICAL IMPRESSION(S) / ED DIAGNOSES  Final diagnoses:  Upper abdominal pain      Carrie Mew, MD 09/17/15 2033

## 2016-12-23 IMAGING — US US ABDOMEN LIMITED
1 series · 14 of 25 positions shown · non-contrast
Comparison: None.

CLINICAL DATA: Right upper quadrant pain and tenderness which is
worse after eating. Symptoms for 2 months. Initial encounter.

EXAM:
US ABDOMEN LIMITED - RIGHT UPPER QUADRANT

[Series 1: us abdomen limited · 0.22mm/px · 14 of 48 slices shown]
[im 1/48]
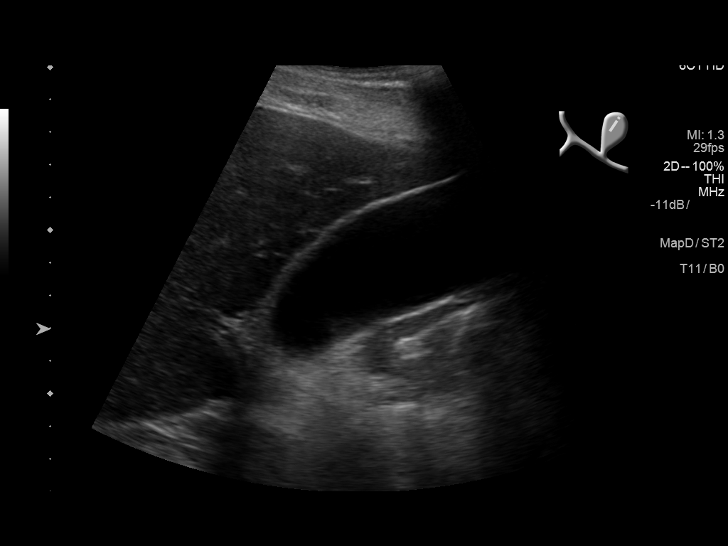
[im 4/48]
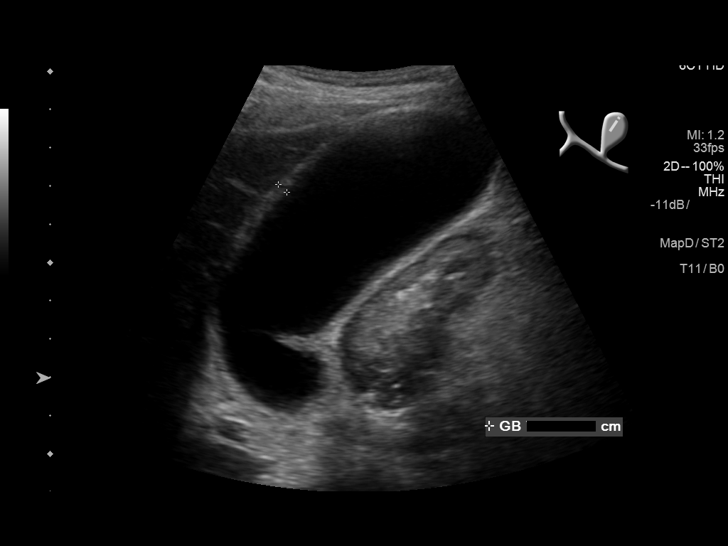
[im 8/48]
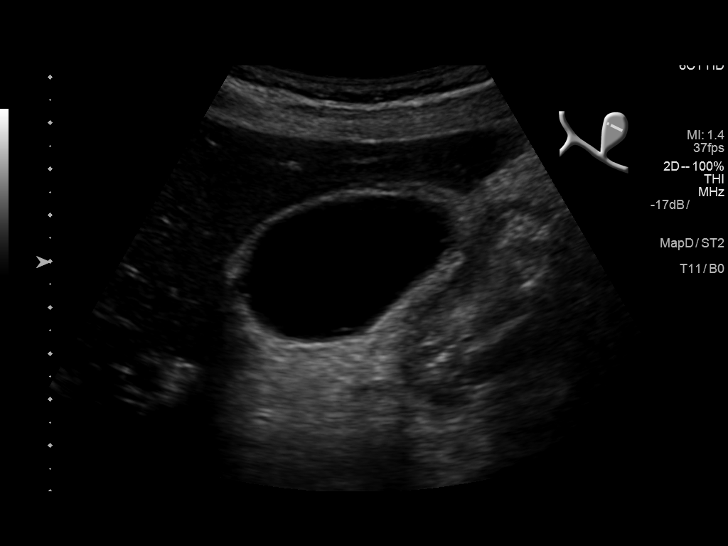
[im 12/48]
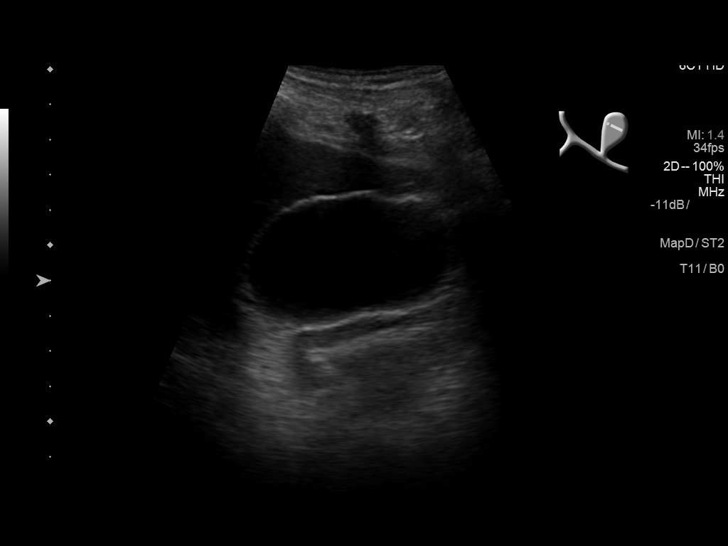
[im 16/48]
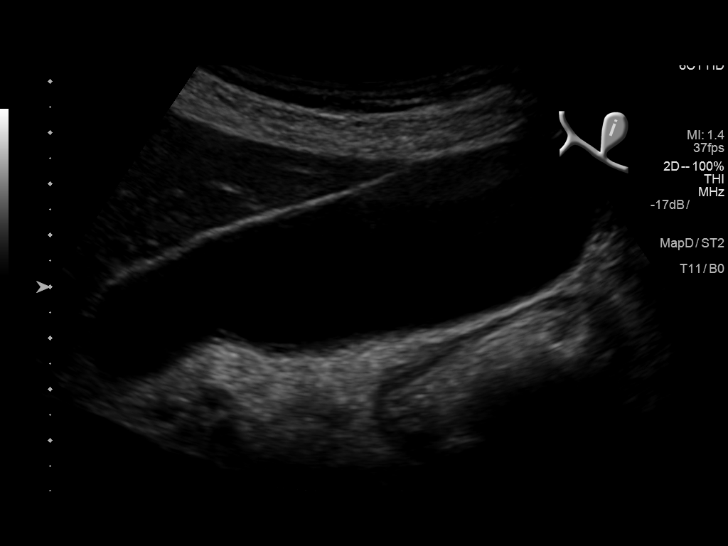
[im 18/48]
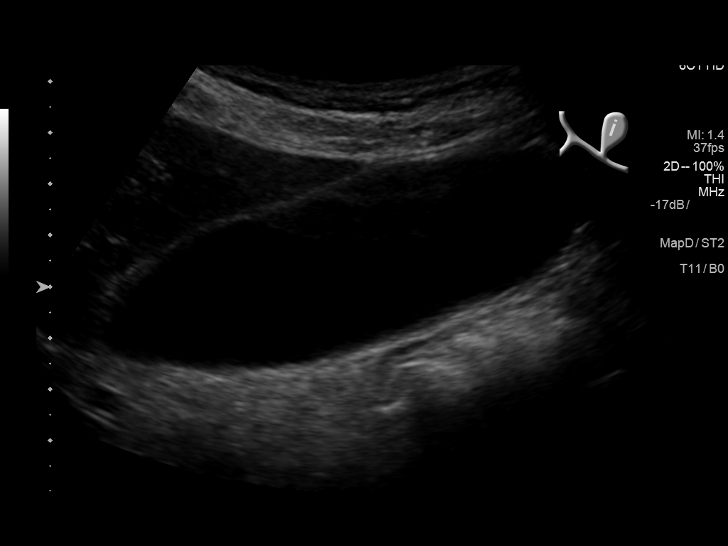
[im 22/48]
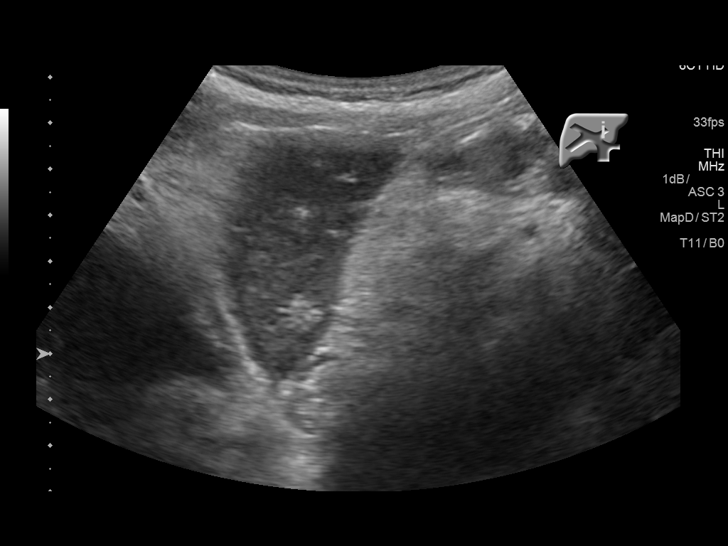
[im 26/48]
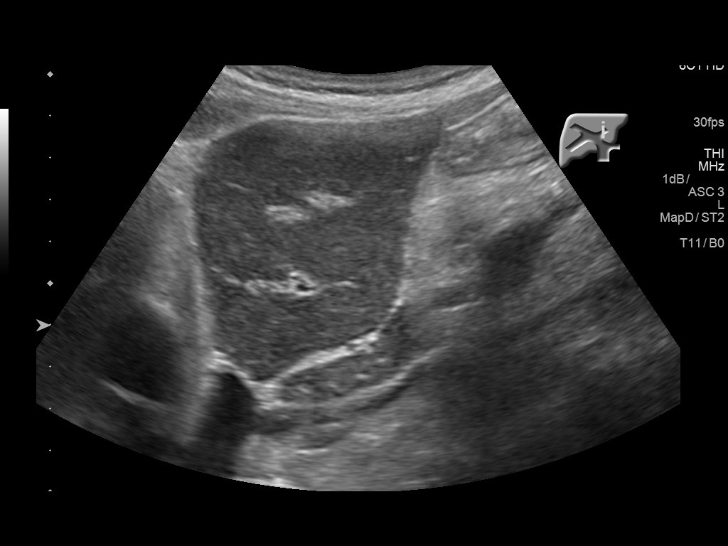
[im 30/48]
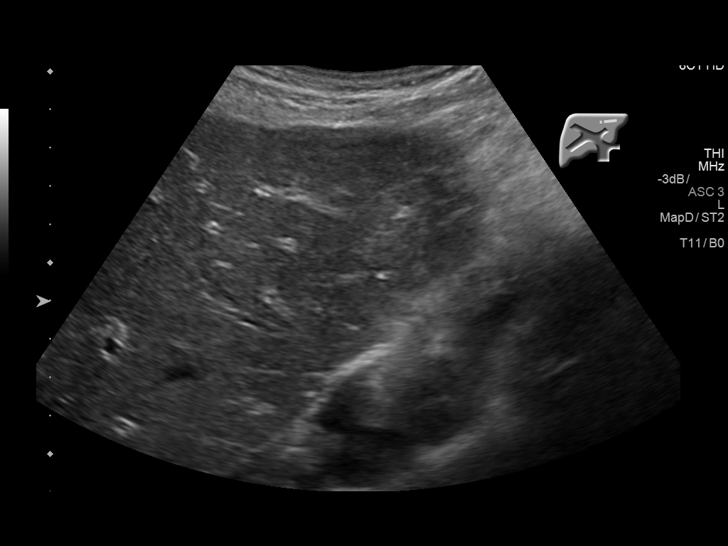
[im 32/48]
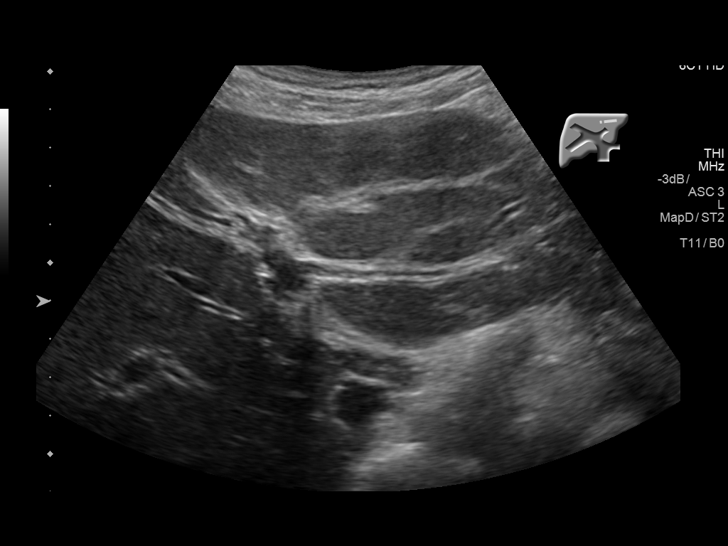
[im 36/48]
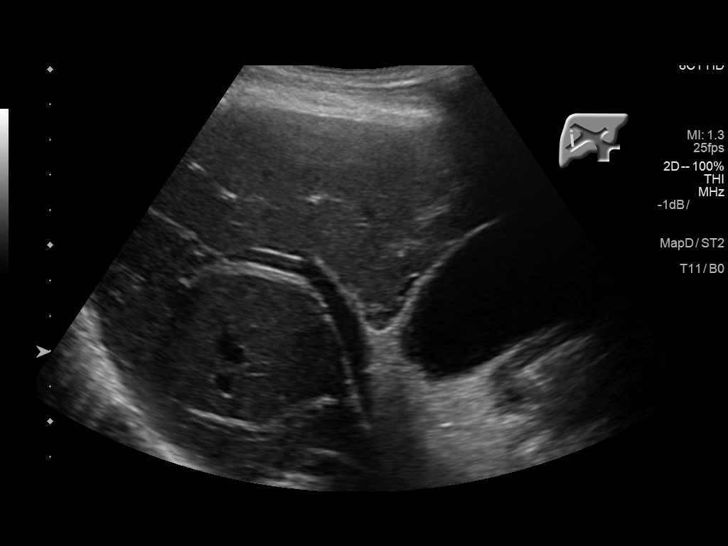
[im 40/48]
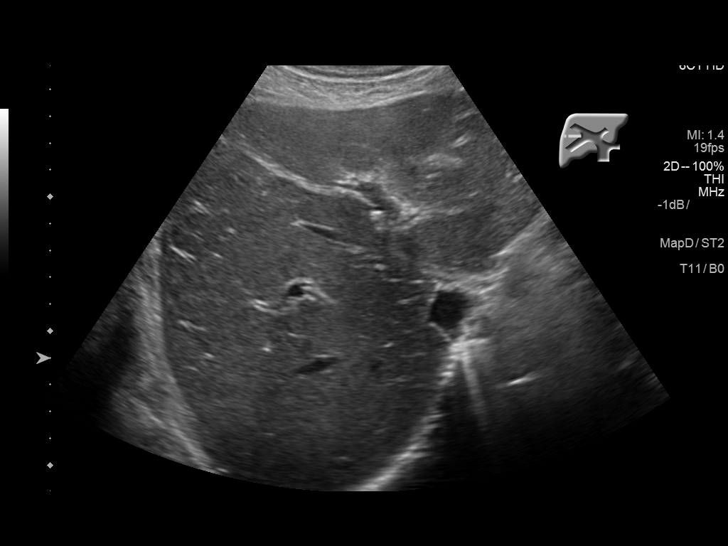
[im 44/48]
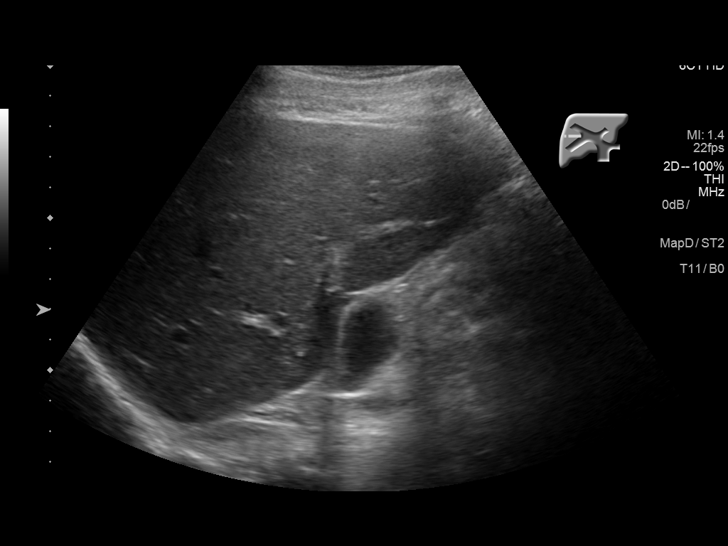
[im 48/48]
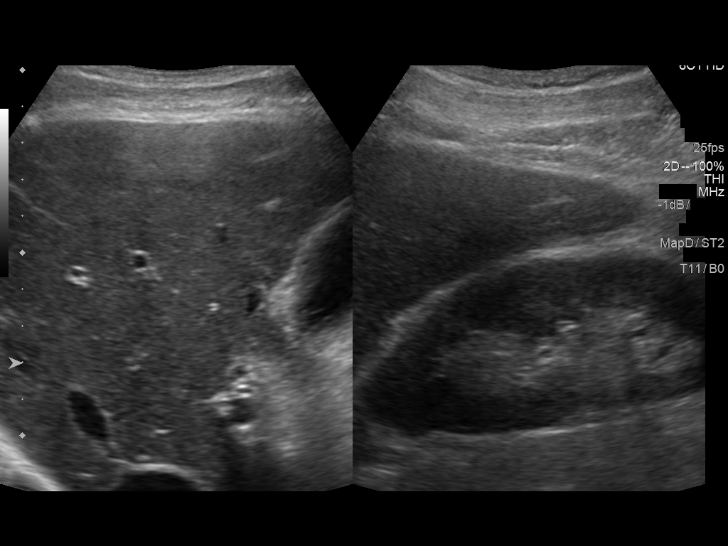

[14 of 25 positions shown; findings below may reference images not displayed]

FINDINGS: Gallbladder:

No gallstones or wall thickening visualized. No sonographic Murphy
sign noted by sonographer.

Common bile duct:

Diameter: 0.2 cm.

Liver:

No focal lesion identified. Within normal limits in parenchymal
echogenicity.
IMPRESSION: Negative for gallstones.  Negative exam.
